# Patient Record
Sex: Female | Born: 1975 | Race: Black or African American | Hispanic: No | Marital: Single | Smoking: Former smoker
Health system: Southern US, Community
[De-identification: ages and names within clinical notes are randomized; demographics above are authoritative.]

## PROBLEM LIST (undated history)

## (undated) HISTORY — PX: FEMUR FRACTURE SURGERY: SHX633

---

## 2011-04-24 ENCOUNTER — Encounter (HOSPITAL_COMMUNITY): Payer: Self-pay

## 2011-04-24 ENCOUNTER — Emergency Department (HOSPITAL_COMMUNITY)
Admission: EM | Admit: 2011-04-24 | Discharge: 2011-04-24 | Disposition: A | Discharge status: Home / Self Care | Payer: Self-pay | Attending: Emergency Medicine | Admitting: Emergency Medicine

## 2011-04-24 DIAGNOSIS — R599 Enlarged lymph nodes, unspecified: Secondary | ICD-10-CM | POA: Insufficient documentation

## 2011-04-24 DIAGNOSIS — F17200 Nicotine dependence, unspecified, uncomplicated: Secondary | ICD-10-CM | POA: Insufficient documentation

## 2011-04-24 DIAGNOSIS — R000 Tachycardia, unspecified: Secondary | ICD-10-CM | POA: Insufficient documentation

## 2011-04-24 DIAGNOSIS — IMO0001 Reserved for inherently not codable concepts without codable children: Secondary | ICD-10-CM | POA: Insufficient documentation

## 2011-04-24 DIAGNOSIS — J029 Acute pharyngitis, unspecified: Secondary | ICD-10-CM | POA: Insufficient documentation

## 2011-04-24 DIAGNOSIS — R509 Fever, unspecified: Secondary | ICD-10-CM | POA: Insufficient documentation

## 2011-04-24 MED ORDER — PENICILLIN G BENZATHINE 1200000 UNIT/2ML IM SUSP
1.2000 10*6.[IU] | Freq: Once | INTRAMUSCULAR | Status: AC
  Administered 2011-04-24: 1.2 10*6.[IU] via INTRAMUSCULAR
  Filled 2011-04-24: qty 2

## 2011-04-24 MED ORDER — DEXAMETHASONE SODIUM PHOSPHATE 10 MG/ML IJ SOLN
10.0000 mg | Freq: Once | INTRAMUSCULAR | Status: AC
  Administered 2011-04-24: 10 mg via INTRAMUSCULAR
  Filled 2011-04-24: qty 1

## 2011-04-24 NOTE — Discharge Instructions (Signed)
You should start to have some symptom improvement in the next 24 hours. You can use numbing throat lozenges (such as cepacol or the generic version) to help with your discomfort; you can also use salt water gargles as discussed below.  Use aceteminophen (tylenol) or ibuprofen as needed for pain and fever. Make sure to stay well hydrated. Return to the ER or your regular doctor if your symptoms get worse rather than better.        Strep Throat Strep throat is an infection of the throat caused by a bacteria named Streptococcus pyogenes. Your caregiver may call the infection streptococcal "tonsillitis" or "pharyngitis" depending on whether there are signs of inflammation in the tonsils or back of the throat. Strep throat is most common in children from 73 to 73 years old during the cold months of the year, but it can occur in people of any age during any season. This infection is spread from person to person (contagious) through coughing, sneezing, or other close contact. SYMPTOMS   Fever or chills.   Painful, swollen, red tonsils or throat.   Pain or difficulty when swallowing.   White or yellow spots on the tonsils or throat.   Swollen, tender lymph nodes or "glands" of the neck or under the jaw.   Red rash all over the body (rare).  DIAGNOSIS  Many different infections can cause the same symptoms. A test must be done to confirm the diagnosis so the right treatment can be given. A "rapid strep test" can help your caregiver make the diagnosis in a few minutes. If this test is not available, a light swab of the infected area can be used for a throat culture test. If a throat culture test is done, results are usually available in a day or two. TREATMENT  Strep throat is treated with antibiotic medicine. HOME CARE INSTRUCTIONS   Gargle with 1 tsp of salt in 1 cup of warm water, 3 to 4 times per day or as needed for comfort.   Family members who also have a sore throat or fever should be  tested for strep throat and treated with antibiotics if they have the strep infection.   Make sure everyone in your household washes their hands well.   Do not share food, drinking cups, or personal items that could cause the infection to spread to others.   You may need to eat a soft food diet until your sore throat gets better.   Drink enough water and fluids to keep your urine clear or pale yellow. This will help prevent dehydration.   Get plenty of rest.   Stay home from school, daycare, or work until you have been on antibiotics for 24 hours.   Only take over-the-counter or prescription medicines for pain, discomfort, or fever as directed by your caregiver.   If antibiotics are prescribed, take them as directed. Finish them even if you start to feel better.  SEEK MEDICAL CARE IF:   The glands in your neck continue to enlarge.   You develop a rash, cough, or earache.   You cough up green, yellow-brown, or bloody sputum.   You have pain or discomfort not controlled by medicines.   Your problems seem to be getting worse rather than better.  SEEK IMMEDIATE MEDICAL CARE IF:   You develop any new symptoms such as vomiting, severe headache, stiff or painful neck, chest pain, shortness of breath, or trouble swallowing.   You develop severe throat pain, drooling, or  as vomiting, severe headache, stiff or painful neck, chest pain, shortness of breath, or trouble swallowing.   You develop severe throat pain, drooling, or changes in your voice.   You develop swelling of the neck, or the skin on the neck becomes red and tender.   You have a fever.   You develop signs of dehydration, such as fatigue, dry mouth, and decreased urination.   You become increasingly sleepy, or you cannot wake up completely.  Document Released: 01/15/2000 Document Revised: 01/06/2011 Document Reviewed: 03/18/2010  [employer] Patient Information 2012 [employer].

## 2011-04-24 NOTE — ED Notes (Signed)
Pt c/o of sore throat and fever with chills. Hurts to swallow. Minor children at bedside with pt.

## 2011-04-24 NOTE — ED Provider Notes (Signed)
Medical screening examination/treatment/procedure(s) were performed by non-physician practitioner and as supervising physician I was immediately available for consultation/collaboration.  Ethelda Chick, MD 04/24/11 1016

## 2011-04-24 NOTE — ED Notes (Signed)
Pt with c/o sore throat starting Friday, pain progressively worse, fever, reports painful swallowing

## 2011-04-24 NOTE — ED Provider Notes (Signed)
History     CSN: [id number]  Arrival date & time 04/24/11  4098   First MD Initiated Contact with Patient 04/24/11 [phone]      Chief Complaint  Patient presents with  . Sore Throat    (Consider location/radiation/quality/duration/timing/severity/associated sxs/prior treatment) The history is provided by the patient.   patient is a generally healthy 36 year old female who presents with a chief complaint of a sore throat x2 days. There has been associated fever, chills, myalgias. Denies nasal congestion, neck pain/swelling/stiffness, cough, N/V, abd pain. Has tried acetaminophen, allergy medication without relief. Swallowing makes pain worse, nothing makes it better.  History reviewed. No pertinent past medical history.  Past Surgical History  Procedure Date  . Femur fracture surgery   . Cesarean section     History reviewed. No pertinent family history.  History  Substance Use Topics  . Smoking status: Current Everyday Smoker  . Smokeless tobacco: Not on file  . Alcohol Use: Yes     social    OB History    Grav Para Term Preterm Abortions TAB SAB Ect Mult Living   4 3   1  1   3       Review of Systems  Constitutional: Positive for fever and chills.  HENT: Positive for sore throat. Negative for congestion, trouble swallowing, neck pain, neck stiffness and voice change.   Eyes: Negative for pain and visual disturbance.  Respiratory: Negative for cough and shortness of breath.   Cardiovascular: Negative for chest pain.  Gastrointestinal: Negative for nausea and vomiting.  Skin: Negative for rash.  Neurological: Negative for headaches.    Allergies  Review of patient's allergies indicates no known allergies.  Home Medications  No current outpatient prescriptions on file.  BP 122/75  Pulse 113  Temp(Src) 100.3 F (37.9 C) (Oral)  Resp 18  SpO2 98%  LMP 03/27/2011  Physical Exam  Nursing note and vitals reviewed. Constitutional: She is oriented to person,  place, and time. She appears well-developed and well-nourished. No distress.  HENT:  Head: Normocephalic and atraumatic. No trismus in the jaw.  Right Ear: Tympanic membrane and external ear normal.  Left Ear: Tympanic membrane and external ear normal.  Nose: No mucosal edema or rhinorrhea.  Mouth/Throat: Mucous membranes are normal. No uvula swelling. Oropharyngeal exudate, posterior oropharyngeal edema and posterior oropharyngeal erythema present. No tonsillar abscesses.  Eyes: Conjunctivae are normal. Pupils are equal, round, and reactive to light.  Neck: Normal range of motion. Neck supple.  Cardiovascular: Regular rhythm and normal heart sounds.        Tachycardia with rate 110 on exam.  Pulmonary/Chest: Effort normal and breath sounds normal. No respiratory distress. She has no wheezes. She exhibits no tenderness.  Abdominal: Soft. Bowel sounds are normal. She exhibits no distension. There is no tenderness.  Lymphadenopathy:    She has cervical adenopathy.  Neurological: She is alert and oriented to person, place, and time.  Skin: Skin is warm and dry.    ED Course  Procedures (including critical care time)  Labs Reviewed - No data to display No results found.   1. Pharyngitis       MDM  4/4 centor criteria to suggest strep pharyngitis. Will tx based on clinical examination rather than waiting for strep swab. No s/s ludwig angina, tonsillar abscess, airway compromise. Tachycardia on exam, likely secondary to fever and pain. Blood pressure nml. Bicillin, steroid in ED.         Shaaron Adler, PA-C 04/24/11  1001 

## 2012-06-09 ENCOUNTER — Emergency Department (HOSPITAL_COMMUNITY): Payer: Medicaid Other

## 2012-06-09 ENCOUNTER — Emergency Department (HOSPITAL_COMMUNITY)
Admission: EM | Admit: 2012-06-09 | Discharge: 2012-06-09 | Disposition: A | Discharge status: Home / Self Care | Payer: Medicaid Other | Attending: Emergency Medicine | Admitting: Emergency Medicine

## 2012-06-09 ENCOUNTER — Encounter (HOSPITAL_COMMUNITY): Payer: Self-pay | Admitting: Cardiology

## 2012-06-09 DIAGNOSIS — F17200 Nicotine dependence, unspecified, uncomplicated: Secondary | ICD-10-CM | POA: Insufficient documentation

## 2012-06-09 DIAGNOSIS — Y92009 Unspecified place in unspecified non-institutional (private) residence as the place of occurrence of the external cause: Secondary | ICD-10-CM | POA: Insufficient documentation

## 2012-06-09 DIAGNOSIS — M79672 Pain in left foot: Secondary | ICD-10-CM

## 2012-06-09 DIAGNOSIS — W010XXA Fall on same level from slipping, tripping and stumbling without subsequent striking against object, initial encounter: Secondary | ICD-10-CM | POA: Insufficient documentation

## 2012-06-09 DIAGNOSIS — Y9389 Activity, other specified: Secondary | ICD-10-CM | POA: Insufficient documentation

## 2012-06-09 DIAGNOSIS — S8990XA Unspecified injury of unspecified lower leg, initial encounter: Secondary | ICD-10-CM | POA: Insufficient documentation

## 2012-06-09 MED ORDER — IBUPROFEN 400 MG PO TABS
800.0000 mg | ORAL_TABLET | Freq: Once | ORAL | Status: AC
  Administered 2012-06-09: 800 mg via ORAL
  Filled 2012-06-09: qty 2

## 2012-06-09 NOTE — ED Provider Notes (Signed)
History    This chart was scribed for Glade Nurse (PA) non-physician practitioner working with Carleene Cooper III, MD by Sofie Rower, ED Scribe. This patient was seen in room TR07C/TR07C and the patient's care was started at 3:14PM.   CSN: [id number]  Arrival date & time 06/09/12  1412   First MD Initiated Contact with Patient 06/09/12 1514      Chief Complaint  Patient presents with  . Foot Pain    (Consider location/radiation/quality/duration/timing/severity/associated sxs/prior treatment) Patient is a 37 y.o. female presenting with lower extremity pain. The history is provided by the patient. No language interpreter was used.  Foot Pain This is a new problem. The current episode started 3 to 5 hours ago. The problem occurs constantly. The problem has been gradually worsening. Pertinent negatives include no headaches. The symptoms are aggravated by walking and stress. Nothing relieves the symptoms. She has tried acetaminophen and a cold compress for the symptoms. The treatment provided no relief.    Isabella Freeman is a 37 y.o. female , presents to the Emergency Department complaining of sudden, progressively worsening, non radiating foot pain, located at the left foot, onset today (06/09/12). The pt reports she was outside cutting the grass earlier this morning, where she suddenly slipped, fell and impacted upon her left foot. The pt has taken tylenol PTA, which she informs, does not provide relief of the left foot pain. Modifying factors include application of pressure and ambulation upon the left foot which intensifies the foot pain.  The pt denies hip pain, hitting her head, and LOC.  The pt is a current everyday smoker, in addition to drinking alcohol socially.      History reviewed. No pertinent past medical history.  Past Surgical History  Procedure Laterality Date  . Femur fracture surgery    . Cesarean section      History reviewed. No pertinent family history.  History   Substance Use Topics  . Smoking status: Current Every Day Smoker  . Smokeless tobacco: Not on file  . Alcohol Use: Yes     Comment: social    OB History   Grav Para Term Preterm Abortions TAB SAB Ect Mult Living   4 3   1  1   3       Review of Systems  Musculoskeletal: Positive for joint swelling and arthralgias.       Pain distal to 3rd metatarsal on dorsal aspect of foot.  Neurological: Negative for weakness, numbness and headaches.    Allergies  Review of patient's allergies indicates no known allergies.  Home Medications  No current outpatient prescriptions on file.  BP 143/99  Pulse 107  Temp(Src) 97 F (36.1 C) (Oral)  Resp 18  SpO2 100%  LMP 05/27/2012  Physical Exam  Nursing note and vitals reviewed. Constitutional: She is oriented to person, place, and time. She appears well-developed and well-nourished. No distress.  HENT:  Head: Normocephalic and atraumatic.  Eyes: Conjunctivae and EOM are normal.  Neck: Normal range of motion. Neck supple.  No meningeal signs  Cardiovascular: Normal rate, regular rhythm and normal heart sounds.  Exam reveals no gallop and no friction rub.   No murmur heard. Pulmonary/Chest: Effort normal and breath sounds normal. No respiratory distress. She has no wheezes. She has no rales. She exhibits no tenderness.  Abdominal: Soft. Bowel sounds are normal. She exhibits no distension. There is no tenderness. There is no rebound and no guarding.  Musculoskeletal: Normal range of motion. She exhibits no  edema and no tenderness.  No step-offs noted on C-spine Full range of motion of the T-spine and L-spine No tenderness to palpation of the spinous processes of the C-spine, T-spine or L-spine Mild tenderness to palpation of the paraspinous muscles Normal strength in upper and lower extremities bilaterally including dorsiflexion and plantar flexion, strong and equal grip strength Tenderness to palpation of dorsal aspect of foot, distal  to the 3rd metatarsal. Good pedal pulses  Neurological: She is alert and oriented to person, place, and time. No cranial nerve deficit.    Neurovascularly intact.  Dorsal/plantar flexion intact.   Skin: Skin is warm and dry. She is not diaphoretic. No erythema.    ED Course  Procedures (including critical care time)  DIAGNOSTIC STUDIES: Oxygen Saturation is 100% on room air, normal by my interpretation.    COORDINATION OF CARE:  3:36 PM- Treatment plan concerning radiology results, ice, elevation, and  discussed with patient. Pt agrees with treatment.   Medications  ibuprofen (ADVIL,MOTRIN) tablet 800 mg (800 mg Oral Given 06/09/12 1542)      Labs Reviewed - No data to display   No results found for this or any previous visit. Dg Foot Complete Left  06/09/2012  *RADIOLOGY REPORT*  Clinical Data: Fall.  Foot pain.  LEFT FOOT - COMPLETE 3+ VIEW  Comparison: None.  Findings: Anatomic alignment.  No fracture.  Calcaneal spur incidentally noted.  Soft tissues appear within normal limits.  IMPRESSION: No acute osseous abnormality.   Original Report Authenticated By: Andreas Newport, M.D.       1. Foot pain, left       MDM  Imaging shows no fracture. Good pedal pulses. Directed pt to ice injury, take acetaminophen or ibuprofen for pain, and to elevate and rest the injury when possible. Ace wrap and crutches provided.   I personally performed the services described in this documentation, which was scribed in my presence. The recorded information has been reviewed and is accurate.    Glade Nurse, PA-C 06/09/12 1626  Glade Nurse, PA-C 06/09/12 [phone]

## 2012-06-09 NOTE — ED Notes (Signed)
Pt reports that she was outside cutting grass and slipped. Pt c/o left foot pain. States when she fell it was on her left side and on her foot. Denies any LOC.

## 2012-06-09 NOTE — ED Notes (Signed)
Pt transported to xray 

## 2012-06-09 NOTE — Progress Notes (Signed)
Orthopedic Tech Progress Note Patient Details:  Isabella Freeman [birth year] [id number] Ace wrap and crutches fitted for patient. Patient demonstrated proper crutch use.  Ortho Devices Type of Ortho Device: Ace wrap;Crutches Ortho Device/Splint Location: Left LE Ortho Device/Splint Interventions: Application   Asia R Thompson 06/09/2012, 3:50 PM

## 2012-06-11 NOTE — ED Provider Notes (Signed)
Medical screening examination/treatment/procedure(s) were performed by non-physician practitioner and as supervising physician I was immediately available for consultation/collaboration.   Carleene Cooper III, MD 06/11/12 [phone]

## 2012-06-15 ENCOUNTER — Encounter: Payer: Self-pay | Admitting: Obstetrics and Gynecology

## 2012-07-19 ENCOUNTER — Ambulatory Visit (INDEPENDENT_AMBULATORY_CARE_PROVIDER_SITE_OTHER): Payer: Medicaid Other | Admitting: Obstetrics

## 2012-07-19 ENCOUNTER — Encounter: Payer: Self-pay | Admitting: Obstetrics & Gynecology

## 2012-07-19 ENCOUNTER — Encounter: Payer: Self-pay | Admitting: Obstetrics

## 2012-07-19 VITALS — BP 106/75 | HR 86 | Temp 98.8°F | Ht 63.0 in | Wt 243.0 lb

## 2012-07-19 DIAGNOSIS — N920 Excessive and frequent menstruation with regular cycle: Secondary | ICD-10-CM

## 2012-07-19 DIAGNOSIS — R109 Unspecified abdominal pain: Secondary | ICD-10-CM

## 2012-07-19 NOTE — Progress Notes (Signed)
Subjective:    Isabella Freeman is a 37 y.o. female who presents for evaluation of oligomenorrhea. Currently periods are occurring every 4 weeks. Bleeding is excessive with clots. Periods were regular in the past occurring every 4 weeks. Patient has no relevant history of abnormal sexual development. Is there a chance of pregnancy? no. HCG lab test done? not applicable, not done. Factors that may be contributory to menstrual abnormalities include: diet normal. Previous treatments for menstrual abnormalities include: none. Pt states she was referred by her PCP to discuss the possibility of an endometrial ablation. Pt states she was also informed she has a cyst on her left ovary.   Menstrual History: OB History   Grav Para Term Preterm Abortions TAB SAB Ect Mult Living   4 3   1  1   3       Menarche age: 46 Patient's last menstrual period was 07/02/2012.    The following portions of the patient's history were reviewed and updated as appropriate: allergies, current medications, past family history, past medical history, past social history, past surgical history and problem list.  Review of Systems Pertinent items are noted in HPI.    Objective:    BP 106/75  Pulse 86  Temp(Src) 98.8 F (37.1 C)  Ht 5\' 3"  (1.6 m)  Wt 243 lb (110.224 kg)  BMI 43.06 kg/m2  LMP 07/02/2012 General:   alert and no distress  Skin:   normal  Lungs:   not examined  Heart:   not examined  Breasts:   self-exam is taught and encouraged and mammogram encouraged  Abdomen:  normal findings: soft, non-tender  Pelvis:  Vulva and vagina appear normal. Bimanual exam reveals normal uterus and adnexa.   Lab Review Previous ultrasound reviewed    Assessment:    The patient has menorrhagia.    Plan:    All questions answered. Pelvic ultrasound. Endometrial biopsy and ablation discussed.

## 2012-07-20 LAB — WET PREP BY MOLECULAR PROBE
Candida species: NEGATIVE
Trichomonas vaginosis: NEGATIVE

## 2012-07-25 ENCOUNTER — Other Ambulatory Visit: Payer: Self-pay | Admitting: Obstetrics

## 2012-07-25 DIAGNOSIS — N760 Acute vaginitis: Secondary | ICD-10-CM

## 2012-07-25 MED ORDER — METRONIDAZOLE 500 MG PO TABS: 500.0000 mg | ORAL_TABLET | Freq: Two times a day (BID) | ORAL | Status: DC

## 2012-07-25 NOTE — Progress Notes (Signed)
Per Dr. Clearance Coots to treat BV with Flagyl 500 mg bid x 7 days. Pt aware of results and prescription sent to pt's pharmacy. Pt expressed understanding.

## 2012-08-06 ENCOUNTER — Encounter: Payer: Self-pay | Admitting: Obstetrics

## 2012-08-06 ENCOUNTER — Ambulatory Visit (INDEPENDENT_AMBULATORY_CARE_PROVIDER_SITE_OTHER): Payer: Medicaid Other | Admitting: Obstetrics

## 2012-08-06 VITALS — BP 113/77 | HR 71 | Wt 245.0 lb

## 2012-08-06 DIAGNOSIS — N390 Urinary tract infection, site not specified: Secondary | ICD-10-CM

## 2012-08-06 DIAGNOSIS — N920 Excessive and frequent menstruation with regular cycle: Secondary | ICD-10-CM

## 2012-08-06 LAB — POCT URINALYSIS DIPSTICK
Blood, UA: 50
Ketones, UA: NEGATIVE
Leukocytes, UA: NEGATIVE
pH, UA: 5

## 2012-08-06 MED ORDER — MEDROXYPROGESTERONE ACETATE 10 MG PO TABS: 10.0000 mg | ORAL_TABLET | Freq: Every day | ORAL | Status: DC

## 2012-08-06 NOTE — Progress Notes (Signed)
Subjective:     Isabella Freeman is a 37 y.o. female here for a routine exam.  Current complaints: follow up visit. Pt states she was in the office on her last visit to discuss for heavy menstrual cycles. Pt states she discussed an ablation but no decision was made. Pt states she is currently on a menstrual cycle and it is very heavy and all she wants to do is sleep Pt states she was also informed that she had a UTI at her last visit. Pt has finished her antibiotics.   Personal health questionnaire reviewed: yes.   Gynecologic History Patient's last menstrual period was 08/03/2012. Contraception: tubal ligation Last Pap: 2014. Results were: normal Last mammogram: n/a. Results were: n/a  Obstetric History OB History   Grav Para Term Preterm Abortions TAB SAB Ect Mult Living   4 3   1  1   3      # Outc Date GA Lbr Len/2nd Wgt Sex Del Anes PTL Lv   1 PAR            2 PAR            3 PAR            4 SAB                The following portions of the patient's history were reviewed and updated as appropriate: allergies, current medications, past family history, past medical history, past social history, past surgical history and problem list.  Review of Systems Pertinent items are noted in HPI.    Objective:    General appearance: alert and no distress Abdomen: normal findings: soft, non-tender Pelvic: cervix normal in appearance, external genitalia normal, no adnexal masses or tenderness, no cervical motion tenderness, uterus normal size, shape, and consistency and vagina normal without discharge    Assessment:    Menorrhagia.  Desires Endometrial Ablation.    Plan:    Provera 10 mg p o daily x 1 month to thin out endometrium for ablation.  Plan to do HTA ablation in 5-6 weeks.

## 2012-09-03 ENCOUNTER — Ambulatory Visit: Payer: Medicaid Other | Admitting: Obstetrics

## 2012-09-03 ENCOUNTER — Ambulatory Visit (INDEPENDENT_AMBULATORY_CARE_PROVIDER_SITE_OTHER): Payer: Medicaid Other | Admitting: Obstetrics

## 2012-09-03 ENCOUNTER — Encounter: Payer: Self-pay | Admitting: Obstetrics

## 2012-09-03 DIAGNOSIS — B9689 Other specified bacterial agents as the cause of diseases classified elsewhere: Secondary | ICD-10-CM

## 2012-09-03 DIAGNOSIS — A499 Bacterial infection, unspecified: Secondary | ICD-10-CM

## 2012-09-03 DIAGNOSIS — N920 Excessive and frequent menstruation with regular cycle: Secondary | ICD-10-CM

## 2012-09-03 DIAGNOSIS — N760 Acute vaginitis: Secondary | ICD-10-CM

## 2012-09-03 MED ORDER — MEDROXYPROGESTERONE ACETATE 10 MG PO TABS: 10.0000 mg | ORAL_TABLET | Freq: Every day | ORAL | Status: DC

## 2012-09-03 NOTE — Progress Notes (Signed)
Subjective:     Isabella Freeman is a 37 y.o. female here for a routine exam.  Current complaints: follow up regarding bleeding. Pt is interested in the Novasure. Pt is currently using Provera and states it is helping with her bleeding.  Personal health questionnaire reviewed: yes.   Gynecologic History Patient's last menstrual period was 08/03/2012. Contraception: tubal ligation Last Pap: 2014. Results were: normal   Obstetric History OB History   Grav Para Term Preterm Abortions TAB SAB Ect Mult Living   4 3   1  1   3      # Outc Date GA Lbr Len/2nd Wgt Sex Del Anes PTL Lv   1 PAR            2 PAR            3 PAR            4 SAB                The following portions of the patient's history were reviewed and updated as appropriate: allergies, current medications, past family history, past medical history, past social history, past surgical history and problem list.  Review of Systems Pertinent items are noted in HPI.    Objective:    General appearance: alert and no distress Abdomen: normal findings: soft, non-tender Pelvic: cervix normal in appearance, external genitalia normal, no adnexal masses or tenderness, no cervical motion tenderness, uterus normal size, shape, and consistency and vagina normal without discharge Extremities: extremities normal, atraumatic, no cyanosis or edema    Assessment:    Healthy female exam.   Menorrhagia   Plan:    Education reviewed: Management of Menorrhagia.. Contraception: tubal ligation. Follow up in: Post op weeks. HTA Endometrial Ablation planned in a few weeks.  Continue Provera.

## 2012-09-25 ENCOUNTER — Encounter: Payer: Self-pay | Admitting: Obstetrics

## 2012-09-25 ENCOUNTER — Other Ambulatory Visit: Payer: Self-pay | Admitting: Obstetrics

## 2012-09-25 DIAGNOSIS — N920 Excessive and frequent menstruation with regular cycle: Secondary | ICD-10-CM

## 2012-09-25 MED ORDER — MEDROXYPROGESTERONE ACETATE 10 MG PO TABS: 10.0000 mg | ORAL_TABLET | Freq: Every day | ORAL | Status: DC

## 2012-10-30 ENCOUNTER — Other Ambulatory Visit: Payer: Self-pay | Admitting: *Deleted

## 2012-10-30 ENCOUNTER — Encounter: Payer: Self-pay | Admitting: Obstetrics

## 2012-10-31 ENCOUNTER — Encounter (HOSPITAL_COMMUNITY): Payer: Self-pay | Admitting: Pharmacist

## 2012-11-02 ENCOUNTER — Ambulatory Visit (HOSPITAL_COMMUNITY): Payer: Medicaid Other | Admitting: Anesthesiology

## 2012-11-02 ENCOUNTER — Encounter (HOSPITAL_COMMUNITY): Payer: Self-pay | Admitting: Obstetrics

## 2012-11-02 ENCOUNTER — Encounter (HOSPITAL_COMMUNITY): Payer: Self-pay | Admitting: Anesthesiology

## 2012-11-02 ENCOUNTER — Ambulatory Visit (HOSPITAL_COMMUNITY)
Admission: RE | Admit: 2012-11-02 | Discharge: 2012-11-02 | Disposition: A | Discharge status: Home / Self Care | Payer: Medicaid Other | Source: Ambulatory Visit | Attending: Obstetrics | Admitting: Obstetrics

## 2012-11-02 ENCOUNTER — Encounter (HOSPITAL_COMMUNITY)
Admission: RE | Disposition: A | Discharge status: Home / Self Care | Payer: Self-pay | Source: Ambulatory Visit | Attending: Obstetrics

## 2012-11-02 DIAGNOSIS — N949 Unspecified condition associated with female genital organs and menstrual cycle: Secondary | ICD-10-CM | POA: Insufficient documentation

## 2012-11-02 DIAGNOSIS — N921 Excessive and frequent menstruation with irregular cycle: Secondary | ICD-10-CM

## 2012-11-02 DIAGNOSIS — N938 Other specified abnormal uterine and vaginal bleeding: Secondary | ICD-10-CM | POA: Insufficient documentation

## 2012-11-02 HISTORY — PX: DILITATION & CURRETTAGE/HYSTROSCOPY WITH HYDROTHERMAL ABLATION: SHX5570

## 2012-11-02 LAB — CBC
Hemoglobin: 11.2 g/dL — ABNORMAL LOW (ref 12.0–15.0)
MCH: 23.3 pg — ABNORMAL LOW (ref 26.0–34.0)
RBC: 4.8 MIL/uL (ref 3.87–5.11)

## 2012-11-02 SURGERY — DILATATION & CURETTAGE/HYSTEROSCOPY WITH HYDROTHERMAL ABLATION
Anesthesia: General | Site: Vagina | Wound class: Clean Contaminated

## 2012-11-02 MED ORDER — OXYCODONE-ACETAMINOPHEN 10-325 MG PO TABS: 1.0000 | ORAL_TABLET | ORAL | Status: DC | PRN

## 2012-11-02 MED ORDER — LIDOCAINE HCL (CARDIAC) 20 MG/ML IV SOLN
INTRAVENOUS | Status: AC
  Filled 2012-11-02: qty 5

## 2012-11-02 MED ORDER — LIDOCAINE HCL (CARDIAC) 20 MG/ML IV SOLN
INTRAVENOUS | Status: DC | PRN
  Administered 2012-11-02: 30 mg via INTRAVENOUS
  Administered 2012-11-02: 70 mg via INTRAVENOUS

## 2012-11-02 MED ORDER — KETOROLAC TROMETHAMINE 30 MG/ML IJ SOLN: 15.0000 mg | Freq: Once | INTRAMUSCULAR | Status: DC | PRN

## 2012-11-02 MED ORDER — FENTANYL CITRATE 0.05 MG/ML IJ SOLN
INTRAMUSCULAR | Status: DC | PRN
  Administered 2012-11-02 (×2): 50 ug via INTRAVENOUS

## 2012-11-02 MED ORDER — IBUPROFEN 800 MG PO TABS: 800.0000 mg | ORAL_TABLET | Freq: Three times a day (TID) | ORAL | Status: DC | PRN

## 2012-11-02 MED ORDER — SODIUM CHLORIDE 0.9 % IJ SOLN: 3.0000 mL | Freq: Two times a day (BID) | INTRAMUSCULAR | Status: DC

## 2012-11-02 MED ORDER — KETOROLAC TROMETHAMINE 30 MG/ML IJ SOLN
INTRAMUSCULAR | Status: DC | PRN
  Administered 2012-11-02: 30 mg via INTRAVENOUS

## 2012-11-02 MED ORDER — ONDANSETRON HCL 4 MG/2ML IJ SOLN: 4.0000 mg | Freq: Once | INTRAMUSCULAR | Status: DC | PRN

## 2012-11-02 MED ORDER — KETOROLAC TROMETHAMINE 30 MG/ML IJ SOLN
INTRAMUSCULAR | Status: AC
  Filled 2012-11-02: qty 1

## 2012-11-02 MED ORDER — ONDANSETRON HCL 4 MG/2ML IJ SOLN
INTRAMUSCULAR | Status: DC | PRN
  Administered 2012-11-02: 4 mg via INTRAVENOUS

## 2012-11-02 MED ORDER — PROPOFOL 10 MG/ML IV BOLUS
INTRAVENOUS | Status: DC | PRN
  Administered 2012-11-02: 180 mg via INTRAVENOUS

## 2012-11-02 MED ORDER — OXYCODONE-ACETAMINOPHEN 5-325 MG PO TABS
ORAL_TABLET | ORAL | Status: AC
  Filled 2012-11-02: qty 1

## 2012-11-02 MED ORDER — LACTATED RINGERS IV SOLN
INTRAVENOUS | Status: DC
  Administered 2012-11-02 (×2): via INTRAVENOUS

## 2012-11-02 MED ORDER — SODIUM CHLORIDE 0.9 % IV SOLN: 250.0000 mL | INTRAVENOUS | Status: DC | PRN

## 2012-11-02 MED ORDER — LIDOCAINE HCL 1 % IJ SOLN
INTRAMUSCULAR | Status: DC | PRN
  Administered 2012-11-02: 20 mL

## 2012-11-02 MED ORDER — SODIUM CHLORIDE 0.9 % IJ SOLN: 3.0000 mL | INTRAMUSCULAR | Status: DC | PRN

## 2012-11-02 MED ORDER — MEPERIDINE HCL 25 MG/ML IJ SOLN: 6.2500 mg | INTRAMUSCULAR | Status: DC | PRN

## 2012-11-02 MED ORDER — MIDAZOLAM HCL 2 MG/2ML IJ SOLN
INTRAMUSCULAR | Status: DC | PRN
  Administered 2012-11-02: 1 mg via INTRAVENOUS

## 2012-11-02 MED ORDER — PROPOFOL 10 MG/ML IV EMUL
INTRAVENOUS | Status: AC
  Filled 2012-11-02: qty 20

## 2012-11-02 MED ORDER — FENTANYL CITRATE 0.05 MG/ML IJ SOLN
25.0000 ug | INTRAMUSCULAR | Status: DC | PRN
  Administered 2012-11-02: 25 ug via INTRAVENOUS
  Administered 2012-11-02: 50 ug via INTRAVENOUS

## 2012-11-02 MED ORDER — MIDAZOLAM HCL 2 MG/2ML IJ SOLN
INTRAMUSCULAR | Status: AC
  Filled 2012-11-02: qty 2

## 2012-11-02 MED ORDER — FLUCONAZOLE 150 MG PO TABS: 150.0000 mg | ORAL_TABLET | Freq: Once | ORAL | Status: DC

## 2012-11-02 MED ORDER — MORPHINE SULFATE 4 MG/ML IJ SOLN: 4.0000 mg | INTRAMUSCULAR | Status: DC | PRN

## 2012-11-02 MED ORDER — ONDANSETRON HCL 4 MG/2ML IJ SOLN: 4.0000 mg | Freq: Four times a day (QID) | INTRAMUSCULAR | Status: DC | PRN

## 2012-11-02 MED ORDER — ACETAMINOPHEN 650 MG RE SUPP
650.0000 mg | RECTAL | Status: DC | PRN
  Filled 2012-11-02: qty 1

## 2012-11-02 MED ORDER — OXYCODONE-ACETAMINOPHEN 5-325 MG PO TABS
1.0000 | ORAL_TABLET | Freq: Once | ORAL | Status: AC
  Administered 2012-11-02: 1 via ORAL

## 2012-11-02 MED ORDER — KETOROLAC TROMETHAMINE 30 MG/ML IJ SOLN: 30.0000 mg | Freq: Four times a day (QID) | INTRAMUSCULAR | Status: DC

## 2012-11-02 MED ORDER — SODIUM CHLORIDE 0.9 % IR SOLN
Status: DC | PRN
  Administered 2012-11-02: 3000 mL

## 2012-11-02 MED ORDER — ONDANSETRON HCL 4 MG/2ML IJ SOLN
INTRAMUSCULAR | Status: AC
  Filled 2012-11-02: qty 2

## 2012-11-02 MED ORDER — FENTANYL CITRATE 0.05 MG/ML IJ SOLN
INTRAMUSCULAR | Status: AC
  Administered 2012-11-02: 25 ug via INTRAVENOUS
  Filled 2012-11-02: qty 2

## 2012-11-02 MED ORDER — ACETAMINOPHEN 325 MG PO TABS: 650.0000 mg | ORAL_TABLET | ORAL | Status: DC | PRN

## 2012-11-02 MED ORDER — FENTANYL CITRATE 0.05 MG/ML IJ SOLN
INTRAMUSCULAR | Status: AC
  Filled 2012-11-02: qty 2

## 2012-11-02 SURGICAL SUPPLY — 18 items
CATH ROBINSON RED A/P 16FR (CATHETERS) ×2 IMPLANT
CLOTH BEACON ORANGE TIMEOUT ST (SAFETY) ×2 IMPLANT
CONTAINER PREFILL 10% NBF 60ML (FORM) ×4 IMPLANT
DRAPE HYSTEROSCOPY (DRAPE) ×2 IMPLANT
DRESSING TELFA 8X3 (GAUZE/BANDAGES/DRESSINGS) ×2 IMPLANT
ELECT REM PT RETURN 9FT ADLT (ELECTROSURGICAL)
ELECTRODE REM PT RTRN 9FT ADLT (ELECTROSURGICAL) IMPLANT
GLOVE BIO SURGEON STRL SZ8 (GLOVE) ×4 IMPLANT
GOWN PREVENTION PLUS XLARGE (GOWN DISPOSABLE) ×2 IMPLANT
GOWN STRL REIN XL XLG (GOWN DISPOSABLE) ×4 IMPLANT
NEEDLE SPNL 22GX3.5 QUINCKE BK (NEEDLE) ×2 IMPLANT
NS IRRIG 1000ML POUR BTL (IV SOLUTION) ×2 IMPLANT
PACK VAGINAL MINOR WOMEN LF (CUSTOM PROCEDURE TRAY) ×2 IMPLANT
PAD OB MATERNITY 4.3X12.25 (PERSONAL CARE ITEMS) ×2 IMPLANT
SET GENESYS HTA PROCERVA (MISCELLANEOUS) ×2 IMPLANT
SYR CONTROL 10ML LL (SYRINGE) ×2 IMPLANT
TOWEL OR 17X24 6PK STRL BLUE (TOWEL DISPOSABLE) ×4 IMPLANT
WATER STERILE IRR 1000ML POUR (IV SOLUTION) ×2 IMPLANT

## 2012-11-02 NOTE — Transfer of Care (Signed)
Immediate Anesthesia Transfer of Care Note  Patient: Isabella Freeman  Procedure(s) Performed: Procedure(s): DILATATION & CURETTAGE/HYSTEROSCOPY WITH HYDROTHERMAL ABLATION (N/A)  Patient Location: PACU  Anesthesia Type:General  Level of Consciousness: awake, alert  and oriented  Airway & Oxygen Therapy: Patient Spontanous Breathing nasal cannula  Post-op Assessment: Report given to PACU RN and Post -op Vital signs reviewed and stable  Post vital signs: Reviewed and stable  Complications: No apparent anesthesia complications

## 2012-11-02 NOTE — H&P (Signed)
Isabella Freeman is an 37 y.o. female. H/O heavy periods, unresponsive to medical therapy.  Does not want hysterectomy.  Pertinent Gynecological History: Menses: flow is excessive with use of several pads or tampons on heaviest days Bleeding: dysfunctional uterine bleeding Contraception: tubal ligation DES exposure: denies Blood transfusions: none Sexually transmitted diseases: no past history Previous GYN Procedures: none  Last mammogram: n/a Date: \ Last pap: normal OB History: G4, P3   Menstrual History: Menarche age: 69  No LMP recorded.    History reviewed. No pertinent past medical history.  Past Surgical History  Procedure Laterality Date  . Femur fracture surgery    . Cesarean section      No family history on file.  Social History:  reports that she has been smoking.  She does not have any smokeless tobacco history on file. She reports that  drinks alcohol. She reports that she does not use illicit drugs.  Allergies: No Known Allergies  Prescriptions prior to admission  Medication Sig Dispense Refill  . ibuprofen (ADVIL,MOTRIN) 200 MG tablet Take 400 mg by mouth every 6 (six) hours as needed for pain.      . medroxyPROGESTERone (PROVERA) 10 MG tablet Take 1 tablet (10 mg total) by mouth daily.  30 tablet  1    Review of Systems  All other systems reviewed and are negative.    Height 5\' 3"  (1.6 m), weight 245 lb (111.131 kg). Physical Exam  Constitutional: She is oriented to person, place, and time. She appears well-developed and well-nourished.  HENT:  Head: Normocephalic and atraumatic.  Eyes: Conjunctivae are normal. Pupils are equal, round, and reactive to light.  Neck: Normal range of motion. Neck supple.  Cardiovascular: Normal rate and regular rhythm.   Respiratory: Effort normal.  GI: Soft.  Genitourinary: Vagina normal and uterus normal.  Musculoskeletal: Normal range of motion.  Neurological: She is alert and oriented to person, place, and time.   Skin: Skin is warm and dry.  Psychiatric: She has a normal mood and affect. Her behavior is normal. Judgment and thought content normal.    No results found for this or any previous visit (from the past 24 hour(s)).  No results found.  Assessment/Plan: Menorrhagia.  Hysteroscopy, D&C and HTA Endometrial Ablation planned.  HARPER,CHARLES A 11/02/2012, 1:04 PM

## 2012-11-02 NOTE — Op Note (Signed)
Preop Diagnosis: MENORRHAGIA   Postop Diagnosis: menorrhagia   Procedure: DILATATION & CURETTAGE/HYSTEROSCOPY WITH HYDROTHERMAL ABLATION   Anesthesia: General   Anesthesiologist: Responsible provider cannot be found from this context.   Attending: Brock Bad, MD   Assistant: Surgical Technician  Findings:  Normal endometrial cavity.  No polyps or submucous fibroids.  Pathology: Endometrial curettings  Fluids:  UOP: 30ml  EBL: 30ml  Complications: None  Procedure: The patient was taken to the operating room after risks benefits and alternatives were discussed with patient, the patient verbalized understanding and consent signed and witnessed. The patient was placed under general anesthesia and prepped and draped in normal sterile fashion. A bivalve speculum was placed in the patient's vagina and the anterior lip of the cervix was grasped with a single-tooth tenaculum. The cervix was dilated for passage of the hysteroscope. The uterus sounded to 11cm.  The hysteroscope was introduced into the uterine cavity with findings as noted above. A curettage was performed and currettings sent to pathology. The hysteroscope was reintroduced and hydrothermal ablation was performed without difficulty. The tenaculum and bivalve speculum were removed and there was good hemostasis at the tenaculum sites. Sponge lap and needle count was correct. The patient tolerated procedure well and was returned to the recovery room in good condition.

## 2012-11-02 NOTE — Anesthesia Postprocedure Evaluation (Signed)
Anesthesia Post Note  Patient: Isabella Freeman  Procedure(s) Performed: Procedure(s) (LRB): DILATATION & CURETTAGE/HYSTEROSCOPY WITH HYDROTHERMAL ABLATION (N/A)  Anesthesia type: General  Patient location: PACU  Post pain: Pain level controlled  Post assessment: Post-op Vital signs reviewed  Last Vitals:  Filed Vitals:   11/02/12 1452  BP:   Pulse: 64  Temp:   Resp: 20    Post vital signs: Reviewed  Level of consciousness: sedated  Complications: No apparent anesthesia complications

## 2012-11-02 NOTE — Anesthesia Preprocedure Evaluation (Signed)
Anesthesia Evaluation  Patient identified by MRN, date of birth, ID band Patient awake    Reviewed: Allergy & Precautions, H&P , NPO status , Patient's Chart, lab work & pertinent test results  Airway Mallampati: II TM Distance: >3 FB Neck ROM: full    Dental no notable dental hx. (+) Teeth Intact   Pulmonary Current Smoker,          Cardiovascular negative cardio ROS      Neuro/Psych negative neurological ROS  negative psych ROS   GI/Hepatic negative GI ROS, Neg liver ROS,   Endo/Other  Morbid obesity  Renal/GU negative Renal ROS  negative genitourinary   Musculoskeletal   Abdominal (+) + obese,   Peds  Hematology negative hematology ROS (+)   Anesthesia Other Findings   Reproductive/Obstetrics negative OB ROS                           Anesthesia Physical Anesthesia Plan  ASA: III  Anesthesia Plan: General   Post-op Pain Management:    Induction: Intravenous  Airway Management Planned: LMA  Additional Equipment:   Intra-op Plan:   Post-operative Plan:   Informed Consent: I have reviewed the patients History and Physical, chart, labs and discussed the procedure including the risks, benefits and alternatives for the proposed anesthesia with the patient or authorized representative who has indicated his/her understanding and acceptance.     Plan Discussed with: CRNA and Surgeon  Anesthesia Plan Comments:         Anesthesia Quick Evaluation

## 2012-11-05 ENCOUNTER — Encounter (HOSPITAL_COMMUNITY): Payer: Self-pay | Admitting: Obstetrics

## 2012-11-19 ENCOUNTER — Ambulatory Visit (INDEPENDENT_AMBULATORY_CARE_PROVIDER_SITE_OTHER): Payer: Medicaid Other | Admitting: Obstetrics

## 2012-11-19 ENCOUNTER — Encounter: Payer: Self-pay | Admitting: Obstetrics

## 2012-11-19 VITALS — BP 111/78 | HR 82 | Temp 99.2°F | Wt 239.0 lb

## 2012-11-19 DIAGNOSIS — N920 Excessive and frequent menstruation with regular cycle: Secondary | ICD-10-CM

## 2012-11-19 NOTE — Progress Notes (Signed)
Subjective:     Isabella Freeman is a 37 y.o. female who presents to the clinic 2 weeks status post ablation for abnormal uterine bleeding. Eating a regular diet without difficulty. Bowel movements are normal. Pain is controlled without any medications. Patient reports she has some light spotting occasionally, but no heavy bleeding. Patient still has some watery discharge at time with cramping, but that seems to be improving also.  The following portions of the patient's history were reviewed and updated as appropriate: allergies, current medications, past family history, past medical history, past social history, past surgical history and problem list.  Review of Systems Pertinent items are noted in HPI.    Objective:    BP 111/78  Pulse 82  Temp(Src) 99.2 F (37.3 C)  Wt 239 lb (108.41 kg)  BMI 42.35 kg/m2 General:  alert and no distress  PE:  Uterus NT.         Assessment:    Doing well postoperatively. Operative findings again reviewed. Pathology report discussed.    Plan:    1. Continue any current medications. 2. Wound care discussed. 3. Activity restrictions: none 4. Anticipated return to work: now. 5. Follow up: 3 months

## 2013-02-19 ENCOUNTER — Ambulatory Visit: Payer: Medicaid Other | Admitting: Obstetrics

## 2013-02-28 ENCOUNTER — Other Ambulatory Visit: Payer: Medicaid Other

## 2013-02-28 ENCOUNTER — Other Ambulatory Visit: Payer: Self-pay | Admitting: Physician Assistant

## 2013-02-28 DIAGNOSIS — R109 Unspecified abdominal pain: Secondary | ICD-10-CM

## 2013-03-31 ENCOUNTER — Encounter (HOSPITAL_COMMUNITY): Payer: Self-pay | Admitting: Emergency Medicine

## 2013-03-31 ENCOUNTER — Emergency Department (HOSPITAL_COMMUNITY)
Admission: EM | Admit: 2013-03-31 | Discharge: 2013-03-31 | Disposition: A | Discharge status: Home / Self Care | Payer: No Typology Code available for payment source | Attending: Emergency Medicine | Admitting: Emergency Medicine

## 2013-03-31 DIAGNOSIS — Y9389 Activity, other specified: Secondary | ICD-10-CM | POA: Insufficient documentation

## 2013-03-31 DIAGNOSIS — S139XXA Sprain of joints and ligaments of unspecified parts of neck, initial encounter: Secondary | ICD-10-CM | POA: Insufficient documentation

## 2013-03-31 DIAGNOSIS — S161XXA Strain of muscle, fascia and tendon at neck level, initial encounter: Secondary | ICD-10-CM

## 2013-03-31 DIAGNOSIS — S0990XA Unspecified injury of head, initial encounter: Secondary | ICD-10-CM | POA: Insufficient documentation

## 2013-03-31 DIAGNOSIS — Z87891 Personal history of nicotine dependence: Secondary | ICD-10-CM | POA: Insufficient documentation

## 2013-03-31 DIAGNOSIS — Y92410 Unspecified street and highway as the place of occurrence of the external cause: Secondary | ICD-10-CM | POA: Insufficient documentation

## 2013-03-31 DIAGNOSIS — IMO0002 Reserved for concepts with insufficient information to code with codable children: Secondary | ICD-10-CM | POA: Insufficient documentation

## 2013-03-31 MED ORDER — HYDROCODONE-ACETAMINOPHEN 5-325 MG PO TABS: 1.0000 | ORAL_TABLET | Freq: Four times a day (QID) | ORAL | Status: AC | PRN
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
## 2013-03-31 NOTE — ED Provider Notes (Signed)
Medical screening examination/treatment/procedure(s) were performed by non-physician practitioner and as supervising physician I was immediately available for consultation/collaboration.   EKG Interpretation None       Adda Stokes R. Eriel Dunckel, MD 03/31/13 2359 
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
## 2013-03-31 NOTE — Discharge Instructions (Signed)
Cervical Strain and Sprain (Whiplash) °with Rehab °Cervical strain and sprains are injuries that commonly occur with "whiplash" injuries. Whiplash occurs when the neck is forcefully whipped backward or forward, such as during a motor vehicle accident. The muscles, ligaments, tendons, discs and nerves of the neck are susceptible to injury when this occurs. °SYMPTOMS  °· Pain or stiffness in the front and/or back of neck °· Symptoms may present immediately or up to 24 hours after injury. °· Dizziness, headache, nausea and vomiting. °· Muscle spasm with soreness and stiffness in the neck. °· Tenderness and swelling at the injury site. °CAUSES  °Whiplash injuries often occur during contact sports or motor vehicle accidents.  °RISK INCREASES WITH: °· Osteoarthritis of the spine. °· Situations that make head or neck accidents or trauma more likely. °· High-risk sports (football, rugby, wrestling, hockey, auto racing, gymnastics, diving, contact karate or boxing). °· Poor strength and flexibility of the neck. °· Previous neck injury. °· Poor tackling technique. °· Improperly fitted or padded equipment. °PREVENTION °· Learn and use proper technique (avoid tackling with the head, spearing and head-butting; use proper falling techniques to avoid landing on the head). °· Warm up and stretch properly before activity. °· Maintain physical fitness: °· Strength, flexibility and endurance. °· Cardiovascular fitness. °· Wear properly fitted and padded protective equipment, such as padded soft collars, for participation in contact sports. °PROGNOSIS  °Recovery for cervical strain and sprain injuries is dependent on the extent of the injury. These injuries are usually curable in 1 week to 3 months with appropriate treatment.  °RELATED COMPLICATIONS  °· Temporary numbness and weakness may occur if the nerve roots are damaged, and this may persist until the nerve has completely healed. °· Chronic pain due to frequent recurrence of  symptoms. °· Prolonged healing, especially if activity is resumed too soon (before complete recovery). °TREATMENT  °Treatment initially involves the use of ice and medication to help reduce pain and inflammation. It is also important to perform strengthening and stretching exercises and modify activities that worsen symptoms so the injury does not get worse. These exercises may be performed at home or with a therapist. For patients who experience severe symptoms, a soft padded collar may be recommended to be worn around the neck.  °Improving your posture may help reduce symptoms. Posture improvement includes pulling your chin and abdomen in while sitting or standing. If you are sitting, sit in a firm chair with your buttocks against the back of the chair. While sleeping, try replacing your pillow with a small towel rolled to 2 inches in diameter, or use a cervical pillow or soft cervical collar. Poor sleeping positions delay healing.  °For patients with nerve root damage, which causes numbness or weakness, the use of a cervical traction apparatus may be recommended. Surgery is rarely necessary for these injuries. However, cervical strain and sprains that are present at birth (congenital) may require surgery. °MEDICATION  °· If pain medication is necessary, nonsteroidal anti-inflammatory medications, such as aspirin and ibuprofen, or other minor pain relievers, such as acetaminophen, are often recommended. °· Do not take pain medication for 7 days before surgery. °· Prescription pain relievers may be given if deemed necessary by your caregiver. Use only as directed and only as much as you need. °HEAT AND COLD:  °· Cold treatment (icing) relieves pain and reduces inflammation. Cold treatment should be applied for 10 to 15 minutes every 2 to 3 hours for inflammation and pain and immediately after any activity that   aggravates your symptoms. Use ice packs or an ice massage. °· Heat treatment may be used prior to  performing the stretching and strengthening activities prescribed by your caregiver, physical therapist, or athletic trainer. Use a heat pack or a warm soak. °SEEK MEDICAL CARE IF:  °· Symptoms get worse or do not improve in 2 weeks despite treatment. °· New, unexplained symptoms develop (drugs used in treatment may produce side effects). °EXERCISES °RANGE OF MOTION (ROM) AND STRETCHING EXERCISES - Cervical Strain and Sprain °These exercises may help you when beginning to rehabilitate your injury. In order to successfully resolve your symptoms, you must improve your posture. These exercises are designed to help reduce the forward-head and rounded-shoulder posture which contributes to this condition. Your symptoms may resolve with or without further involvement from your physician, physical therapist or athletic trainer. While completing these exercises, remember:  °· Restoring tissue flexibility helps normal motion to return to the joints. This allows healthier, less painful movement and activity. °· An effective stretch should be held for at least 20 seconds, although you may need to begin with shorter hold times for comfort. °· A stretch should never be painful. You should only feel a gentle lengthening or release in the stretched tissue. °STRETCH- Axial Extensors °· Lie on your back on the floor. You may bend your knees for comfort. Place a rolled up hand towel or dish towel, about 2 inches in diameter, under the part of your head that makes contact with the floor. °· Gently tuck your chin, as if trying to make a "double chin," until you feel a gentle stretch at the base of your head. °· Hold __________ seconds. °Repeat __________ times. Complete this exercise __________ times per day.  °STRETECH - Axial Extension  °· Stand or sit on a firm surface. Assume a good posture: chest up, shoulders drawn back, abdominal muscles slightly tense, knees unlocked (if standing) and feet hip width apart. °· Slowly retract your  chin so your head slides back and your chin slightly lowers.Continue to look straight ahead. °· You should feel a gentle stretch in the back of your head. Be certain not to feel an aggressive stretch since this can cause headaches later. °· Hold for __________ seconds. °Repeat __________ times. Complete this exercise __________ times per day. °STRETCH  Cervical Side Bend  °· Stand or sit on a firm surface. Assume a good posture: chest up, shoulders drawn back, abdominal muscles slightly tense, knees unlocked (if standing) and feet hip width apart. °· Without letting your nose or shoulders move, slowly tip your right / left ear to your shoulder until your feel a gentle stretch in the muscles on the opposite side of your neck. °· Hold __________ seconds. °Repeat __________ times. Complete this exercise __________ times per day. °STRETCH  Cervical Rotators  °· Stand or sit on a firm surface. Assume a good posture: chest up, shoulders drawn back, abdominal muscles slightly tense, knees unlocked (if standing) and feet hip width apart. °· Keeping your eyes level with the ground, slowly turn your head until you feel a gentle stretch along the back and opposite side of your neck. °· Hold __________ seconds. °Repeat __________ times. Complete this exercise __________ times per day. °RANGE OF MOTION - Neck Circles  °· Stand or sit on a firm surface. Assume a good posture: chest up, shoulders drawn back, abdominal muscles slightly tense, knees unlocked (if standing) and feet hip width apart. °· Gently roll your head down and around from   the back of one shoulder to the back of the other. The motion should never be forced or painful. °· Repeat the motion 10-20 times, or until you feel the neck muscles relax and loosen. °Repeat __________ times. Complete the exercise __________ times per day. °STRENGTHENING EXERCISES - Cervical Strain and Sprain °These exercises may help you when beginning to rehabilitate your injury. They may  resolve your symptoms with or without further involvement from your physician, physical therapist or athletic trainer. While completing these exercises, remember:  °· Muscles can gain both the endurance and the strength needed for everyday activities through controlled exercises. °· Complete these exercises as instructed by your physician, physical therapist or athletic trainer. Progress the resistance and repetitions only as guided. °· You may experience muscle soreness or fatigue, but the pain or discomfort you are trying to eliminate should never worsen during these exercises. If this pain does worsen, stop and make certain you are following the directions exactly. If the pain is still present after adjustments, discontinue the exercise until you can discuss the trouble with your clinician. °STRENGTH Cervical Flexors, Isometric °· Face a wall, standing about 6 inches away. Place a small pillow, a ball about 6-8 inches in diameter, or a folded towel between your forehead and the wall. °· Slightly tuck your chin and gently push your forehead into the soft object. Push only with mild to moderate intensity, building up tension gradually. Keep your jaw and forehead relaxed. °· Hold 10 to 20 seconds. Keep your breathing relaxed. °· Release the tension slowly. Relax your neck muscles completely before you start the next repetition. °Repeat __________ times. Complete this exercise __________ times per day. °STRENGTH- Cervical Lateral Flexors, Isometric  °· Stand about 6 inches away from a wall. Place a small pillow, a ball about 6-8 inches in diameter, or a folded towel between the side of your head and the wall. °· Slightly tuck your chin and gently tilt your head into the soft object. Push only with mild to moderate intensity, building up tension gradually. Keep your jaw and forehead relaxed. °· Hold 10 to 20 seconds. Keep your breathing relaxed. °· Release the tension slowly. Relax your neck muscles completely before  you start the next repetition. °Repeat __________ times. Complete this exercise __________ times per day. °STRENGTH  Cervical Extensors, Isometric  °· Stand about 6 inches away from a wall. Place a small pillow, a ball about 6-8 inches in diameter, or a folded towel between the back of your head and the wall. °· Slightly tuck your chin and gently tilt your head back into the soft object. Push only with mild to moderate intensity, building up tension gradually. Keep your jaw and forehead relaxed. °· Hold 10 to 20 seconds. Keep your breathing relaxed. °· Release the tension slowly. Relax your neck muscles completely before you start the next repetition. °Repeat __________ times. Complete this exercise __________ times per day. °POSTURE AND BODY MECHANICS CONSIDERATIONS - Cervical Strain and Sprain °Keeping correct posture when sitting, standing or completing your activities will reduce the stress put on different body tissues, allowing injured tissues a chance to heal and limiting painful experiences. The following are general guidelines for improved posture. Your physician or physical therapist will provide you with any instructions specific to your needs. While reading these guidelines, remember: °· The exercises prescribed by your provider will help you have the flexibility and strength to maintain correct postures. °· The correct posture provides the optimal environment for your joints to   work. All of your joints have less wear and tear when properly supported by a spine with good posture. This means you will experience a healthier, less painful body. °· Correct posture must be practiced with all of your activities, especially prolonged sitting and standing. Correct posture is as important when doing repetitive low-stress activities (typing) as it is when doing a single heavy-load activity (lifting). °PROLONGED STANDING WHILE SLIGHTLY LEANING FORWARD °When completing a task that requires you to lean forward while  standing in one place for a long time, place either foot up on a stationary 2-4 inch high object to help maintain the best posture. When both feet are on the ground, the low back tends to lose its slight inward curve. If this curve flattens (or becomes too large), then the back and your other joints will experience too much stress, fatigue more quickly and can cause pain.  °RESTING POSITIONS °Consider which positions are most painful for you when choosing a resting position. If you have pain with flexion-based activities (sitting, bending, stooping, squatting), choose a position that allows you to rest in a less flexed posture. You would want to avoid curling into a fetal position on your side. If your pain worsens with extension-based activities (prolonged standing, working overhead), avoid resting in an extended position such as sleeping on your stomach. Most people will find more comfort when they rest with their spine in a more neutral position, neither too rounded nor too arched. Lying on a non-sagging bed on your side with a pillow between your knees, or on your back with a pillow under your knees will often provide some relief. Keep in mind, being in any one position for a prolonged period of time, no matter how correct your posture, can still lead to stiffness. °WALKING °Walk with an upright posture. Your ears, shoulders and hips should all line-up. °OFFICE WORK °When working at a desk, create an environment that supports good, upright posture. Without extra support, muscles fatigue and lead to excessive strain on joints and other tissues. °CHAIR: °· A chair should be able to slide under your desk when your back makes contact with the back of the chair. This allows you to work closely. °· The chair's height should allow your eyes to be level with the upper part of your monitor and your hands to be slightly lower than your elbows. °· Body position: °· Your feet should make contact with the floor. If this is  not possible, use a foot rest. °· Keep your ears over your shoulders. This will reduce stress on your neck and low back. °Document Released: 01/17/2005 Document Revised: 05/14/2012 Document Reviewed: 05/01/2008 °[employer]® Patient Information ©2014 [employer]. °Motor Vehicle Collision  °It is common to have multiple bruises and sore muscles after a motor vehicle collision (MVC). These tend to feel worse for the first 24 hours. You may have the most stiffness and soreness over the first several hours. You may also feel worse when you wake up the first morning after your collision. After this point, you will usually begin to improve with each day. The speed of improvement often depends on the severity of the collision, the number of injuries, and the location and nature of these injuries. °HOME CARE INSTRUCTIONS  °· Put ice on the injured area. °· Put ice in a plastic bag. °· Place a towel between your skin and the bag. °· Leave the ice on for 15-20 minutes, 03-04 times a day. °· Drink enough fluids to   keep your urine clear or pale yellow. Do not drink alcohol. °· Take a warm shower or bath once or twice a day. This will increase blood flow to sore muscles. °· You may return to activities as directed by your caregiver. Be careful when lifting, as this may aggravate neck or back pain. °· Only take over-the-counter or prescription medicines for pain, discomfort, or fever as directed by your caregiver. Do not use aspirin. This may increase bruising and bleeding. °SEEK IMMEDIATE MEDICAL CARE IF: °· You have numbness, tingling, or weakness in the arms or legs. °· You develop severe headaches not relieved with medicine. °· You have severe neck pain, especially tenderness in the middle of the back of your neck. °· You have changes in bowel or bladder control. °· There is increasing pain in any area of the body. °· You have shortness of breath, lightheadedness, dizziness, or fainting. °· You have chest pain. °· You feel  sick to your stomach (nauseous), throw up (vomit), or sweat. °· You have increasing abdominal discomfort. °· There is blood in your urine, stool, or vomit. °· You have pain in your shoulder (shoulder strap areas). °· You feel your symptoms are getting worse. °MAKE SURE YOU:  °· Understand these instructions. °· Will watch your condition. °· Will get help right away if you are not doing well or get worse. °Document Released: 01/17/2005 Document Revised: 04/11/2011 Document Reviewed: 06/16/2010 °[employer]® Patient Information ©2014 [employer]. ° °

## 2013-05-28 ENCOUNTER — Ambulatory Visit (INDEPENDENT_AMBULATORY_CARE_PROVIDER_SITE_OTHER): Payer: Medicaid Other | Admitting: Obstetrics

## 2013-05-28 DIAGNOSIS — N899 Noninflammatory disorder of vagina, unspecified: Secondary | ICD-10-CM

## 2013-05-29 ENCOUNTER — Encounter: Payer: Self-pay | Admitting: Obstetrics

## 2013-05-29 NOTE — Addendum Note (Signed)
Addended by: Coral CeoHARPER, Aliese Brannum A on: 05/29/2013 09:53 AM   Modules accepted: Level of Service

## 2013-05-29 NOTE — Progress Notes (Deleted)
HPI    Review of Systems        Physical Exam

## 2013-12-02 ENCOUNTER — Encounter: Payer: Self-pay | Admitting: Obstetrics

## 2015-04-20 IMAGING — CR DG FOOT COMPLETE 3+V*L*
3 series · 3 of 3 positions shown · non-contrast
Comparison: None.

CLINICAL DATA: Fall.  Foot pain.

LEFT FOOT - COMPLETE 3+ VIEW

[t foot ap left]
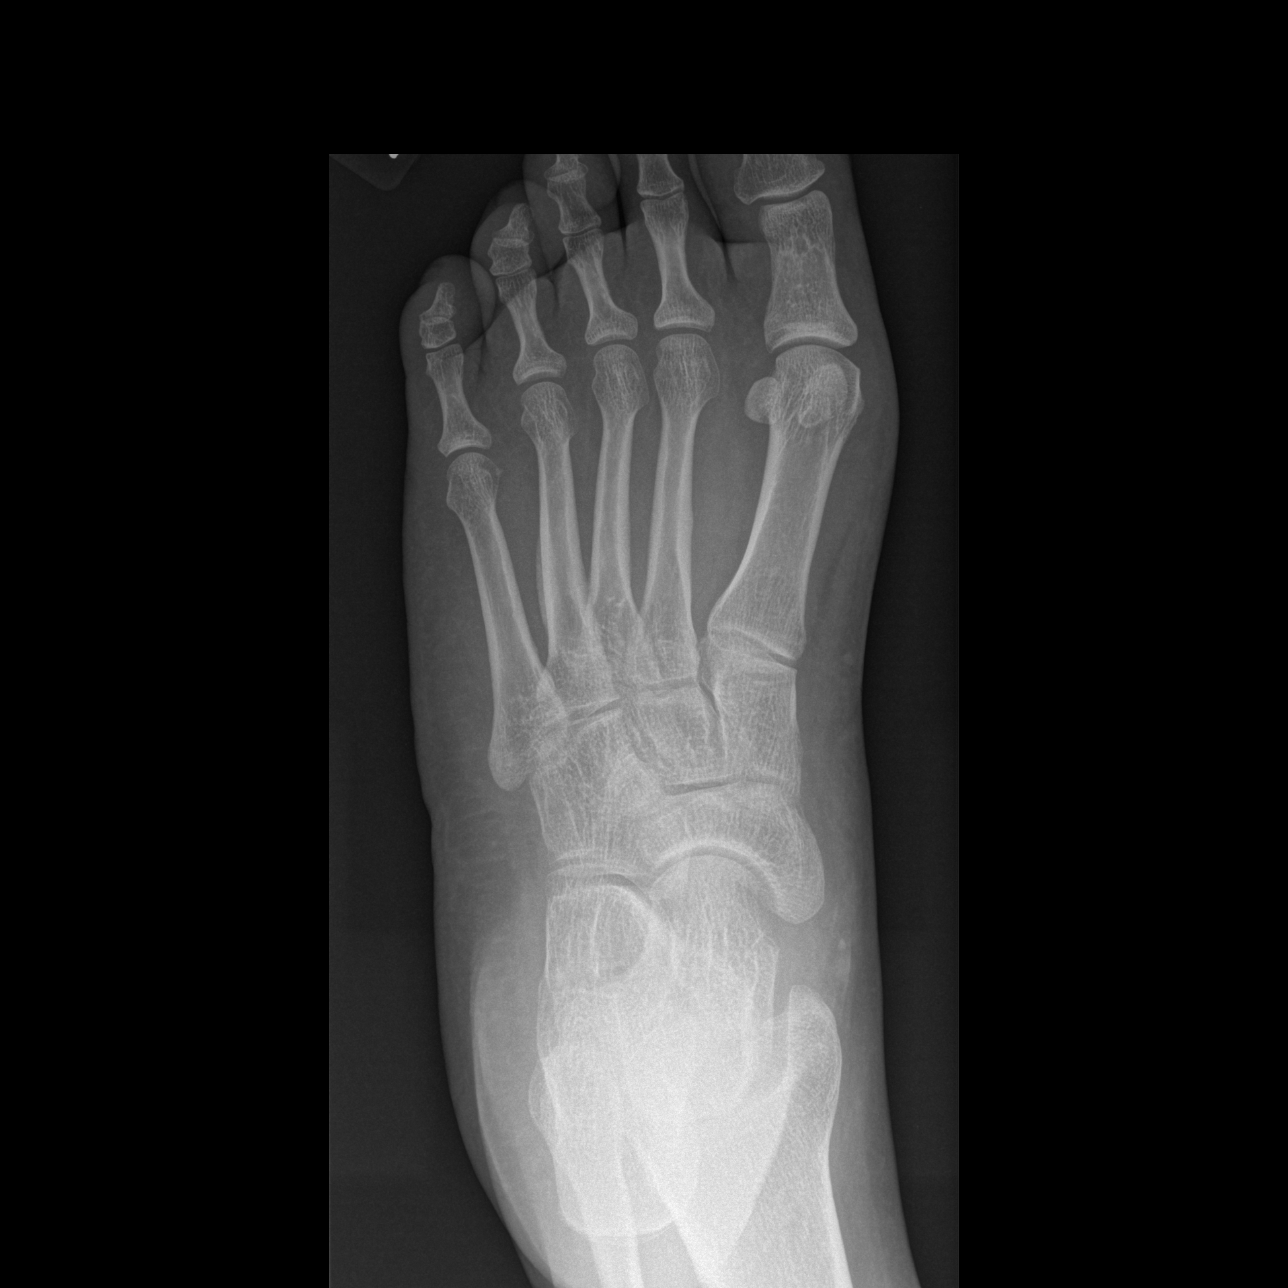

[t foot oblique left]
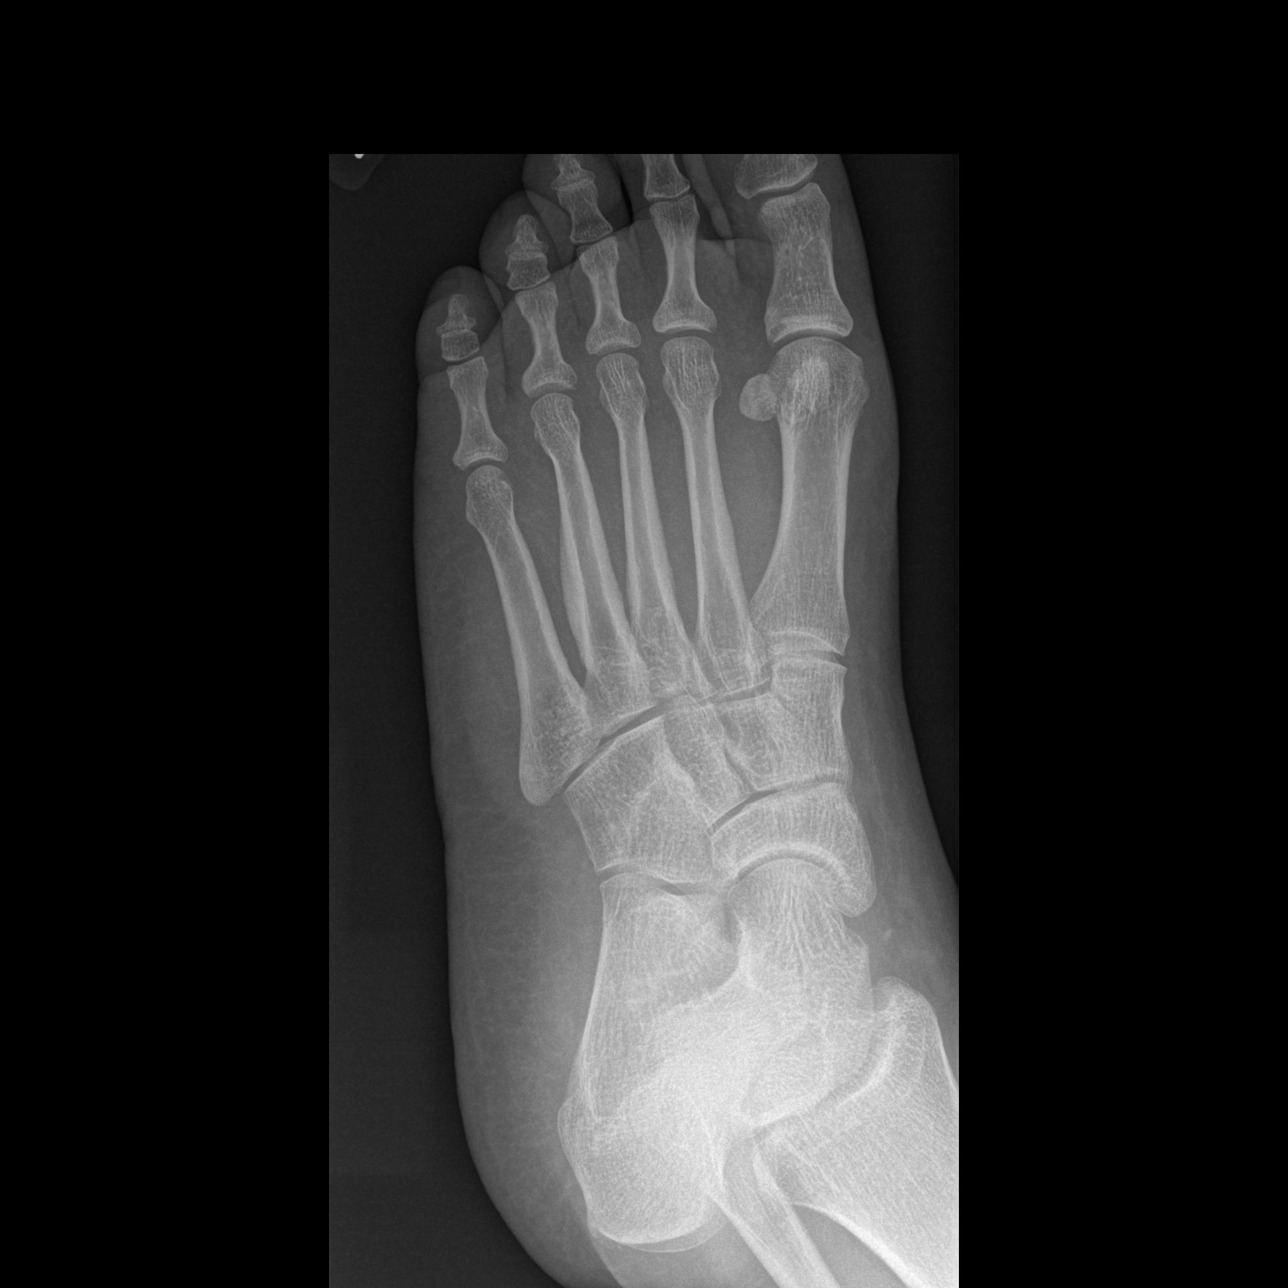

[t foot lat left]
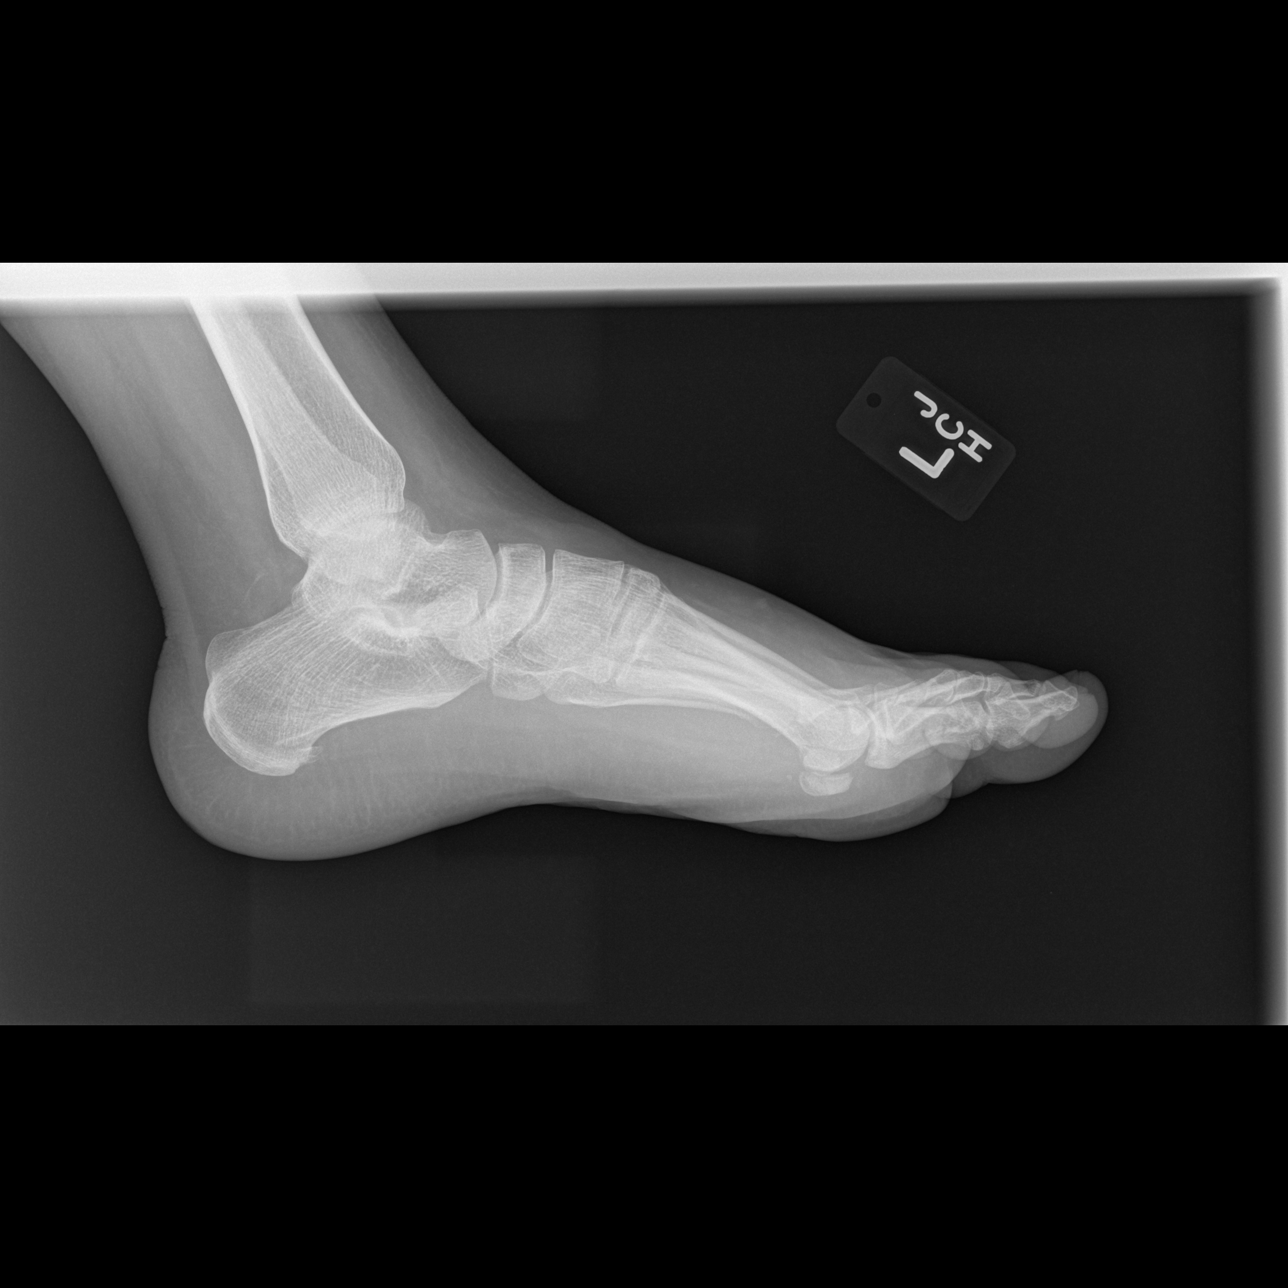

[3 of 3 positions shown; findings below may reference images not displayed]

FINDINGS: Anatomic alignment.  No fracture.  Calcaneal spur
incidentally noted.  Soft tissues appear within normal limits.
IMPRESSION: No acute osseous abnormality.
